# Patient Record
Sex: Male | Born: 2000
Health system: Southern US, Community
[De-identification: ages and names within clinical notes are randomized; demographics above are authoritative.]

## PROBLEM LIST (undated history)

## (undated) DIAGNOSIS — R1310 Dysphagia, unspecified: Secondary | ICD-10-CM

## (undated) DIAGNOSIS — G8929 Other chronic pain: Secondary | ICD-10-CM

## (undated) DIAGNOSIS — M419 Scoliosis, unspecified: Secondary | ICD-10-CM

## (undated) DIAGNOSIS — F988 Other specified behavioral and emotional disorders with onset usually occurring in childhood and adolescence: Secondary | ICD-10-CM

## (undated) DIAGNOSIS — K589 Irritable bowel syndrome without diarrhea: Secondary | ICD-10-CM

## (undated) HISTORY — DX: Scoliosis, unspecified: M41.9

## (undated) HISTORY — PX: OTHER SURGICAL HISTORY: SHX169

## (undated) HISTORY — DX: Other chronic pain: G89.29

## (undated) HISTORY — PX: DENTAL SURGERY: SHX609

## (undated) HISTORY — DX: Dysphagia, unspecified: R13.10

## (undated) HISTORY — DX: Other specified behavioral and emotional disorders with onset usually occurring in childhood and adolescence: F98.8

## (undated) HISTORY — DX: Irritable bowel syndrome without diarrhea: K58.9

## (undated) HISTORY — PX: CIRCUMCISION REVISION: SHX1347

---
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
## 2020-08-07 ENCOUNTER — Ambulatory Visit: Payer: 59 | Admitting: Nurse Practitioner

## 2020-08-27 ENCOUNTER — Ambulatory Visit: Payer: 59 | Admitting: Nurse Practitioner

## 2020-09-17 ENCOUNTER — Ambulatory Visit: Payer: 59 | Admitting: Physician Assistant
